# Patient Record
Sex: Female | Born: 1955 | Hispanic: Yes | Marital: Married
Health system: Southern US, Community
[De-identification: ages and names within clinical notes are randomized; demographics above are authoritative.]

## PROBLEM LIST (undated history)

## (undated) DIAGNOSIS — E119 Type 2 diabetes mellitus without complications: Secondary | ICD-10-CM

---

## 2005-11-03 ENCOUNTER — Emergency Department: Payer: Self-pay | Admitting: Emergency Medicine

## 2006-11-11 ENCOUNTER — Emergency Department: Payer: Self-pay | Admitting: Emergency Medicine

## 2008-08-16 ENCOUNTER — Ambulatory Visit: Payer: Self-pay

## 2012-06-25 ENCOUNTER — Emergency Department: Payer: Self-pay | Admitting: *Deleted

## 2012-06-26 LAB — COMPREHENSIVE METABOLIC PANEL
Albumin: 4.1 g/dL (ref 3.4–5.0)
Alkaline Phosphatase: 128 U/L (ref 50–136)
Anion Gap: 10 (ref 7–16)
BUN: 9 mg/dL (ref 7–18)
Calcium, Total: 9.3 mg/dL (ref 8.5–10.1)
Chloride: 104 mmol/L (ref 98–107)
Creatinine: 0.58 mg/dL — ABNORMAL LOW (ref 0.60–1.30)
Glucose: 140 mg/dL — ABNORMAL HIGH (ref 65–99)
SGOT(AST): 14 U/L — ABNORMAL LOW (ref 15–37)
SGPT (ALT): 28 U/L (ref 12–78)
Total Protein: 8.3 g/dL — ABNORMAL HIGH (ref 6.4–8.2)

## 2012-06-26 LAB — CBC WITH DIFFERENTIAL/PLATELET
Basophil #: 0.1 10*3/uL (ref 0.0–0.1)
Eosinophil #: 0.2 10*3/uL (ref 0.0–0.7)
Eosinophil %: 1.6 %
Lymphocyte #: 3.1 10*3/uL (ref 1.0–3.6)
MCH: 30.6 pg (ref 26.0–34.0)
MCV: 91 fL (ref 80–100)
Monocyte #: 1 x10 3/mm — ABNORMAL HIGH (ref 0.2–0.9)
Neutrophil %: 67.5 %
Platelet: 244 10*3/uL (ref 150–440)
RBC: 4.23 10*6/uL (ref 3.80–5.20)
RDW: 13.5 % (ref 11.5–14.5)

## 2012-06-26 LAB — URINALYSIS, COMPLETE
Bilirubin,UR: NEGATIVE
Blood: NEGATIVE
Glucose,UR: NEGATIVE mg/dL (ref 0–75)
Leukocyte Esterase: NEGATIVE
Nitrite: NEGATIVE
Squamous Epithelial: 3
WBC UR: 2 /HPF (ref 0–5)

## 2016-04-23 ENCOUNTER — Encounter: Payer: Self-pay | Admitting: Medical Oncology

## 2016-04-23 ENCOUNTER — Emergency Department: Payer: Self-pay

## 2016-04-23 ENCOUNTER — Emergency Department
Admission: EM | Admit: 2016-04-23 | Discharge: 2016-04-23 | Disposition: A | Discharge status: Home / Self Care | Payer: Self-pay | Attending: Emergency Medicine | Admitting: Emergency Medicine

## 2016-04-23 DIAGNOSIS — M7918 Myalgia, other site: Secondary | ICD-10-CM

## 2016-04-23 DIAGNOSIS — M545 Low back pain, unspecified: Secondary | ICD-10-CM

## 2016-04-23 DIAGNOSIS — Y92009 Unspecified place in unspecified non-institutional (private) residence as the place of occurrence of the external cause: Secondary | ICD-10-CM | POA: Insufficient documentation

## 2016-04-23 DIAGNOSIS — Y9389 Activity, other specified: Secondary | ICD-10-CM | POA: Insufficient documentation

## 2016-04-23 DIAGNOSIS — Y999 Unspecified external cause status: Secondary | ICD-10-CM | POA: Insufficient documentation

## 2016-04-23 DIAGNOSIS — M25511 Pain in right shoulder: Secondary | ICD-10-CM | POA: Insufficient documentation

## 2016-04-23 DIAGNOSIS — M25552 Pain in left hip: Secondary | ICD-10-CM | POA: Insufficient documentation

## 2016-04-23 MED ORDER — CYCLOBENZAPRINE HCL 10 MG PO TABS: 10.0000 mg | ORAL_TABLET | Freq: Three times a day (TID) | ORAL | 0 refills | Status: AC | PRN

## 2016-04-23 MED ORDER — NAPROXEN 500 MG PO TABS: 500.0000 mg | ORAL_TABLET | Freq: Two times a day (BID) | ORAL | 0 refills | Status: AC
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
## 2016-04-23 NOTE — Progress Notes (Signed)
CSW received consult and requested interpreter services. Awaiting interpreter.   Jonathon JordanLynn B Tylor Courtwright, MSW, Theresia MajorsLCSWA [phone]
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
## 2017-05-20 ENCOUNTER — Other Ambulatory Visit: Payer: Self-pay | Admitting: Family Medicine

## 2017-05-20 DIAGNOSIS — Z1231 Encounter for screening mammogram for malignant neoplasm of breast: Secondary | ICD-10-CM
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
## 2018-09-08 ENCOUNTER — Ambulatory Visit: Payer: Self-pay

## 2018-12-29 ENCOUNTER — Ambulatory Visit: Payer: Self-pay

## 2022-10-10 ENCOUNTER — Ambulatory Visit
Admission: EM | Admit: 2022-10-10 | Discharge: 2022-10-10 | Disposition: A | Discharge status: Home / Self Care | Payer: Medicare Other | Attending: Emergency Medicine | Admitting: Emergency Medicine

## 2022-10-10 DIAGNOSIS — B349 Viral infection, unspecified: Secondary | ICD-10-CM | POA: Diagnosis present

## 2022-10-10 DIAGNOSIS — Z794 Long term (current) use of insulin: Secondary | ICD-10-CM | POA: Insufficient documentation

## 2022-10-10 DIAGNOSIS — Z1152 Encounter for screening for COVID-19: Secondary | ICD-10-CM | POA: Insufficient documentation

## 2022-10-10 DIAGNOSIS — E1165 Type 2 diabetes mellitus with hyperglycemia: Secondary | ICD-10-CM | POA: Insufficient documentation

## 2022-10-10 HISTORY — DX: Type 2 diabetes mellitus without complications: E11.9

## 2022-10-10 LAB — POCT RAPID STREP A (OFFICE): Rapid Strep A Screen: NEGATIVE

## 2022-10-10 LAB — POCT FASTING CBG KUC MANUAL ENTRY: POCT Glucose (KUC): 280 mg/dL — AB (ref 70–99)

## 2022-10-10 MED ORDER — ONDANSETRON 4 MG PO TBDP: 4.0000 mg | ORAL_TABLET | Freq: Three times a day (TID) | ORAL | 0 refills | Status: AC | PRN

## 2022-10-10 NOTE — ED Triage Notes (Signed)
Triage completed using spanish interpreter Jimmie Molly [phone].  Patient to Urgent Care with complaints of generalized body aches/ cough/ sore throat. Reports symptoms started two days ago.  Has not taken temp but reports feeling feverish. Reports two episodes of emesis yesterday. No diarrhea.

## 2022-10-10 NOTE — ED Notes (Signed)
Patient scheduled appointment to establish PCP w/ Romilda Garret at [facility] 2/19 at 2pm.

## 2022-10-10 NOTE — Discharge Instructions (Addendum)
Your strep test is negative.  Your COVID test is pending.    Take Tylenol as needed for fever or discomfort.    Take the antinausea medication as directed.    Keep yourself hydrated with clear liquids, such as water.  Follow the diarrhea diet as tolerated.   Go to the emergency department if you have worsening symptoms.    Follow up with your new primary care provider as scheduled.

## 2022-10-10 NOTE — ED Provider Notes (Signed)
Roderic Palau    CSN: [id number] Arrival date & time: 10/10/22  1617      History   Chief Complaint Chief Complaint  Patient presents with   Fever    HPI Jennifer Preston is a 67 y.o. female.  Patient presents with 2-day history of bodyaches, sore throat, cough.  She does not have a thermometer to take her temperature but states she has felt warm.  She also reports 2 episodes of emesis yesterday.  Treating symptoms with Tylenol; last dose taken at 0900.  Patient denies chest pain, shortness of breath, abdominal pain, diarrhea, or other symptoms.  Her medical history includes diabetes but she is not on medication at this time because she does not have a PCP.  The history is provided by the patient and medical records. A language interpreter was used.    Past Medical History:  Diagnosis Date   Diabetes mellitus without complication ([place])     There are no problems to display for this patient.   History reviewed. No pertinent surgical history.  OB History   No obstetric history on file.      Home Medications    Prior to Admission medications   Medication Sig Start Date End Date Taking? Authorizing Provider  ondansetron (ZOFRAN-ODT) 4 MG disintegrating tablet Take 1 tablet (4 mg total) by mouth every 8 (eight) hours as needed for nausea or vomiting. 10/10/22  Yes Sharion Balloon, NP  cyclobenzaprine (FLEXERIL) 10 MG tablet Take 1 tablet (10 mg total) by mouth 3 (three) times daily as needed for muscle spasms. Patient not taking: Reported on 10/10/2022 04/23/16   Sherrie George B, FNP  naproxen (NAPROSYN) 500 MG tablet Take 1 tablet (500 mg total) by mouth 2 (two) times daily with a meal. Patient not taking: Reported on 10/10/2022 04/23/16   Victorino Dike, FNP    Family History No family history on file.  Social History     Allergies   Patient has no known allergies.   Review of Systems Review of Systems  Constitutional:  Negative for chills and  fever.  HENT:  Positive for sore throat. Negative for ear pain.   Respiratory:  Positive for cough. Negative for shortness of breath.   Cardiovascular:  Negative for chest pain and palpitations.  Gastrointestinal:  Positive for vomiting. Negative for abdominal pain and diarrhea.  All other systems reviewed and are negative.    Physical Exam Triage Vital Signs ED Triage Vitals  Enc Vitals Group     BP      Pulse      Resp      Temp      Temp src      SpO2      Weight      Height      Head Circumference      Peak Flow      Pain Score      Pain Loc      Pain Edu?      Excl. in [place]?    No data found.  Updated Vital Signs BP 137/82   Pulse 89   Temp 97.9 F (36.6 C)   Resp 18   SpO2 96%   Visual Acuity Right Eye Distance:   Left Eye Distance:   Bilateral Distance:    Right Eye Near:   Left Eye Near:    Bilateral Near:     Physical Exam Vitals and nursing note reviewed.  Constitutional:  General: She is not in acute distress.    Appearance: She is well-developed. She is obese. She is ill-appearing.  HENT:     Right Ear: Tympanic membrane normal.     Left Ear: Tympanic membrane normal.     Nose: Nose normal.     Mouth/Throat:     Mouth: Mucous membranes are moist.     Pharynx: Oropharynx is clear.  Cardiovascular:     Rate and Rhythm: Normal rate and regular rhythm.     Heart sounds: Normal heart sounds.  Pulmonary:     Effort: Pulmonary effort is normal. No respiratory distress.     Breath sounds: Normal breath sounds.  Abdominal:     General: Bowel sounds are normal.     Palpations: Abdomen is soft.     Tenderness: There is no abdominal tenderness. There is no guarding or rebound.  Musculoskeletal:     Cervical back: Neck supple.  Skin:    General: Skin is warm and dry.  Neurological:     Mental Status: She is alert.  Psychiatric:        Mood and Affect: Mood normal.        Behavior: Behavior normal.      UC Treatments / Results   Labs (all labs ordered are listed, but only abnormal results are displayed) Labs Reviewed  POCT FASTING CBG [facility] MANUAL ENTRY - Abnormal; Notable for the following components:      Result Value   POCT Glucose ([facility]) 280 (*)    All other components within normal limits  SARS CORONAVIRUS 2 (TAT 6-24 HRS)  POCT RAPID STREP A (OFFICE)    EKG   Radiology No results found.  Procedures Procedures (including critical care time)  Medications Ordered in UC Medications - No data to display  Initial Impression / Assessment and Plan / UC Course  I have reviewed the triage vital signs and the nursing notes.  Pertinent labs & imaging results that were available during my care of the patient were reviewed by me and considered in my medical decision making (see chart for details).    Viral illness, Diabetes with hyperglycemia.  CBG 280 but patient just drank soft drink (not diet).  She reports she has not seen a PCP in years and is not on any medication for diabetes.  Education provided on hyperglycemia.  Patient scheduled for appointment to establish a new PCP.  Rapid strep negative.  COVID pending.  If COVID positive, recommend treatment with molnupiravir.  Treating her nausea with Zofran.  Discussed clear liquids including water.  Education provided on viral illness.  Patient is agreeable to this plan of care.  Final Clinical Impressions(s) / UC Diagnoses   Final diagnoses:  Viral illness  Type 2 diabetes mellitus with hyperglycemia, without long-term current use of insulin [facility])     Discharge Instructions      Your strep test is negative.  Your COVID test is pending.    Take Tylenol as needed for fever or discomfort.    Take the antinausea medication as directed.    Keep yourself hydrated with clear liquids, such as water.  Follow the diarrhea diet as tolerated.   Go to the emergency department if you have worsening symptoms.    Follow up with your new primary care provider as  scheduled.           ED Prescriptions     Medication Sig Dispense Auth. Provider   ondansetron (ZOFRAN-ODT) 4 MG disintegrating tablet  Take 1 tablet (4 mg total) by mouth every 8 (eight) hours as needed for nausea or vomiting. 20 tablet Sharion Balloon, NP      PDMP not reviewed this encounter.   Sharion Balloon, NP 10/10/22 1734

## 2022-10-11 LAB — SARS CORONAVIRUS 2 (TAT 6-24 HRS): SARS Coronavirus 2: NEGATIVE

## 2022-10-20 ENCOUNTER — Ambulatory Visit: Payer: Medicare Other | Admitting: Nurse Practitioner

## 2022-12-05 ENCOUNTER — Ambulatory Visit: Payer: Medicare Other | Admitting: Nurse Practitioner
